# Patient Record
Sex: Male | Born: 1990 | Race: Asian | Hispanic: No | Marital: Single
Health system: Southern US, Community
[De-identification: ages and names within clinical notes are randomized; demographics above are authoritative.]

## PROBLEM LIST (undated history)

## (undated) HISTORY — PX: OTHER SURGICAL HISTORY: SHX169

---
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
## 2010-06-24 ENCOUNTER — Emergency Department (HOSPITAL_BASED_OUTPATIENT_CLINIC_OR_DEPARTMENT_OTHER)
Admission: EM | Admit: 2010-06-24 | Discharge: 2010-06-24 | Disposition: A | Discharge status: Home / Self Care | Payer: BC Managed Care – PPO | Attending: Emergency Medicine | Admitting: Emergency Medicine

## 2010-06-24 DIAGNOSIS — R042 Hemoptysis: Secondary | ICD-10-CM | POA: Insufficient documentation

## 2010-06-24 DIAGNOSIS — K089 Disorder of teeth and supporting structures, unspecified: Secondary | ICD-10-CM | POA: Insufficient documentation

## 2010-06-24 DIAGNOSIS — I10 Essential (primary) hypertension: Secondary | ICD-10-CM | POA: Insufficient documentation

## 2010-06-24 DIAGNOSIS — F17200 Nicotine dependence, unspecified, uncomplicated: Secondary | ICD-10-CM | POA: Insufficient documentation

## 2012-06-08 ENCOUNTER — Ambulatory Visit: Payer: BC Managed Care – PPO | Admitting: Gynecologic Oncology

## 2015-07-21 ENCOUNTER — Emergency Department (HOSPITAL_COMMUNITY): Payer: BLUE CROSS/BLUE SHIELD

## 2015-07-21 ENCOUNTER — Encounter (HOSPITAL_COMMUNITY): Payer: Self-pay | Admitting: *Deleted

## 2015-07-21 ENCOUNTER — Inpatient Hospital Stay (HOSPITAL_COMMUNITY)
Admission: EM | Admit: 2015-07-21 | Discharge: 2015-07-24 | DRG: 958 | Disposition: A | Discharge status: Home / Self Care | Payer: BLUE CROSS/BLUE SHIELD | Attending: General Surgery | Admitting: General Surgery

## 2015-07-21 DIAGNOSIS — T148XXA Other injury of unspecified body region, initial encounter: Secondary | ICD-10-CM

## 2015-07-21 DIAGNOSIS — S27329A Contusion of lung, unspecified, initial encounter: Secondary | ICD-10-CM

## 2015-07-21 DIAGNOSIS — F17210 Nicotine dependence, cigarettes, uncomplicated: Secondary | ICD-10-CM | POA: Diagnosis present

## 2015-07-21 DIAGNOSIS — S27322A Contusion of lung, bilateral, initial encounter: Secondary | ICD-10-CM | POA: Diagnosis present

## 2015-07-21 DIAGNOSIS — S42302B Unspecified fracture of shaft of humerus, left arm, initial encounter for open fracture: Principal | ICD-10-CM | POA: Diagnosis present

## 2015-07-21 DIAGNOSIS — S41142A Puncture wound with foreign body of left upper arm, initial encounter: Secondary | ICD-10-CM | POA: Diagnosis present

## 2015-07-21 DIAGNOSIS — W320XXA Accidental handgun discharge, initial encounter: Secondary | ICD-10-CM | POA: Diagnosis not present

## 2015-07-21 DIAGNOSIS — Y9289 Other specified places as the place of occurrence of the external cause: Secondary | ICD-10-CM

## 2015-07-21 DIAGNOSIS — S21139A Puncture wound without foreign body of unspecified front wall of thorax without penetration into thoracic cavity, initial encounter: Secondary | ICD-10-CM | POA: Diagnosis present

## 2015-07-21 DIAGNOSIS — Z419 Encounter for procedure for purposes other than remedying health state, unspecified: Secondary | ICD-10-CM

## 2015-07-21 DIAGNOSIS — Z720 Tobacco use: Secondary | ICD-10-CM | POA: Diagnosis present

## 2015-07-21 DIAGNOSIS — S41132A Puncture wound without foreign body of left upper arm, initial encounter: Secondary | ICD-10-CM

## 2015-07-21 DIAGNOSIS — W3400XA Accidental discharge from unspecified firearms or gun, initial encounter: Secondary | ICD-10-CM

## 2015-07-21 LAB — COMPREHENSIVE METABOLIC PANEL
ALBUMIN: 4.5 g/dL (ref 3.5–5.0)
ALT: 50 U/L (ref 17–63)
ANION GAP: 14 (ref 5–15)
AST: 35 U/L (ref 15–41)
Alkaline Phosphatase: 64 U/L (ref 38–126)
BUN: 12 mg/dL (ref 6–20)
CHLORIDE: 103 mmol/L (ref 101–111)
CO2: 21 mmol/L — AB (ref 22–32)
Calcium: 9 mg/dL (ref 8.9–10.3)
Creatinine, Ser: 1.34 mg/dL — ABNORMAL HIGH (ref 0.61–1.24)
GFR calc Af Amer: >60 mL/min (ref >60)
GFR calc non Af Amer: >60 mL/min (ref >60)
GLUCOSE: 120 mg/dL — AB (ref 65–99)
POTASSIUM: 3.5 mmol/L (ref 3.5–5.1)
SODIUM: 138 mmol/L (ref 135–145)
TOTAL PROTEIN: 7 g/dL (ref 6.5–8.1)
Total Bilirubin: 0.4 mg/dL (ref 0.3–1.2)

## 2015-07-21 LAB — PROTIME-INR
INR: 1.01 (ref 0.00–1.49)
Prothrombin Time: 13.5 seconds (ref 11.6–15.2)

## 2015-07-21 LAB — CBC
HEMATOCRIT: 48.4 % (ref 39.0–52.0)
HEMOGLOBIN: 17.2 g/dL — AB (ref 13.0–17.0)
MCH: 32.7 pg (ref 26.0–34.0)
MCHC: 35.5 g/dL (ref 30.0–36.0)
MCV: 92 fL (ref 78.0–100.0)
PLATELETS: 247 10*3/uL (ref 150–400)
RBC: 5.26 MIL/uL (ref 4.22–5.81)
RDW: 12.4 % (ref 11.5–15.5)
WBC: 12.1 10*3/uL — ABNORMAL HIGH (ref 4.0–10.5)

## 2015-07-21 LAB — ETHANOL: ALCOHOL ETHYL (B): 17 mg/dL — AB (ref <5)

## 2015-07-21 LAB — SAMPLE TO BLOOD BANK

## 2015-07-21 LAB — I-STAT CG4 LACTIC ACID, ED: Lactic Acid, Venous: 3.42 mmol/L (ref 0.5–2.0)

## 2015-07-21 MED ORDER — ENOXAPARIN SODIUM 40 MG/0.4ML ~~LOC~~ SOLN
40.0000 mg | SUBCUTANEOUS | Status: DC
  Administered 2015-07-22 – 2015-07-23 (×2): 40 mg via SUBCUTANEOUS
  Filled 2015-07-21 (×3): qty 0.4

## 2015-07-21 MED ORDER — TETANUS-DIPHTH-ACELL PERTUSSIS 5-2.5-18.5 LF-MCG/0.5 IM SUSP
0.5000 mL | Freq: Once | INTRAMUSCULAR | Status: AC
  Administered 2015-07-21: 0.5 mL via INTRAMUSCULAR
  Filled 2015-07-21: qty 0.5

## 2015-07-21 MED ORDER — SODIUM CHLORIDE 0.9% FLUSH
3.0000 mL | Freq: Two times a day (BID) | INTRAVENOUS | Status: DC
  Administered 2015-07-22: 3 mL via INTRAVENOUS

## 2015-07-21 MED ORDER — CLINDAMYCIN PHOSPHATE 600 MG/50ML IV SOLN
600.0000 mg | Freq: Once | INTRAVENOUS | Status: AC
  Administered 2015-07-21: 600 mg via INTRAVENOUS
  Filled 2015-07-21: qty 50

## 2015-07-21 MED ORDER — FENTANYL CITRATE (PF) 100 MCG/2ML IJ SOLN
100.0000 ug | Freq: Once | INTRAMUSCULAR | Status: AC
  Administered 2015-07-21: 100 ug via INTRAVENOUS
  Filled 2015-07-21: qty 2

## 2015-07-21 MED ORDER — HYDROMORPHONE HCL 1 MG/ML IJ SOLN
1.0000 mg | INTRAMUSCULAR | Status: DC | PRN
  Administered 2015-07-21 – 2015-07-22 (×3): 2 mg via INTRAVENOUS
  Filled 2015-07-21 (×3): qty 2

## 2015-07-21 MED ORDER — ONDANSETRON HCL 4 MG PO TABS: 4.0000 mg | ORAL_TABLET | Freq: Four times a day (QID) | ORAL | Status: DC | PRN

## 2015-07-21 MED ORDER — SODIUM CHLORIDE 0.9% FLUSH: 3.0000 mL | INTRAVENOUS | Status: DC | PRN

## 2015-07-21 MED ORDER — SODIUM CHLORIDE 0.9 % IV SOLN: 250.0000 mL | INTRAVENOUS | Status: DC | PRN

## 2015-07-21 MED ORDER — HYDROCODONE-ACETAMINOPHEN 5-325 MG PO TABS
1.0000 | ORAL_TABLET | ORAL | Status: DC | PRN
  Administered 2015-07-21 – 2015-07-23 (×4): 2 via ORAL
  Filled 2015-07-21 (×4): qty 2

## 2015-07-21 MED ORDER — ONDANSETRON HCL 4 MG/2ML IJ SOLN
4.0000 mg | Freq: Four times a day (QID) | INTRAMUSCULAR | Status: DC | PRN
  Administered 2015-07-22: 4 mg via INTRAVENOUS
  Filled 2015-07-21: qty 2

## 2015-07-21 MED ORDER — IOHEXOL 350 MG/ML SOLN
100.0000 mL | Freq: Once | INTRAVENOUS | Status: AC | PRN
  Administered 2015-07-21: 100 mL via INTRAVENOUS
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
## 2015-07-21 NOTE — ED Notes (Signed)
Patient transported to CT 
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
## 2015-07-21 NOTE — ED Notes (Signed)
Patient's lactic acid is 3.42 EDP [place] notified
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
## 2015-07-22 ENCOUNTER — Inpatient Hospital Stay (HOSPITAL_COMMUNITY): Payer: BLUE CROSS/BLUE SHIELD

## 2015-07-22 LAB — CBC
HEMATOCRIT: 44.7 % (ref 39.0–52.0)
HEMOGLOBIN: 15.6 g/dL (ref 13.0–17.0)
MCH: 31.8 pg (ref 26.0–34.0)
MCHC: 34.9 g/dL (ref 30.0–36.0)
MCV: 91.2 fL (ref 78.0–100.0)
PLATELETS: 199 10*3/uL (ref 150–400)
RBC: 4.9 MIL/uL (ref 4.22–5.81)
RDW: 12.6 % (ref 11.5–15.5)
WBC: 17.3 10*3/uL — AB (ref 4.0–10.5)

## 2015-07-22 LAB — BASIC METABOLIC PANEL
Anion gap: 8 (ref 5–15)
BUN: 8 mg/dL (ref 6–20)
CALCIUM: 9.4 mg/dL (ref 8.9–10.3)
CO2: 25 mmol/L (ref 22–32)
CREATININE: 1.05 mg/dL (ref 0.61–1.24)
Chloride: 103 mmol/L (ref 101–111)
GFR calc Af Amer: >60 mL/min (ref >60)
GLUCOSE: 104 mg/dL — AB (ref 65–99)
Potassium: 4.3 mmol/L (ref 3.5–5.1)
SODIUM: 136 mmol/L (ref 135–145)

## 2015-07-22 LAB — SURGICAL PCR SCREEN
MRSA, PCR: NEGATIVE
Staphylococcus aureus: NEGATIVE

## 2015-07-22 LAB — URINALYSIS, ROUTINE W REFLEX MICROSCOPIC
Bilirubin Urine: NEGATIVE
Glucose, UA: NEGATIVE mg/dL
KETONES UR: NEGATIVE mg/dL
LEUKOCYTES UA: NEGATIVE
NITRITE: NEGATIVE
PH: 6.5 (ref 5.0–8.0)
PROTEIN: NEGATIVE mg/dL
Specific Gravity, Urine: 1.039 — ABNORMAL HIGH (ref 1.005–1.030)

## 2015-07-22 LAB — URINE MICROSCOPIC-ADD ON

## 2015-07-22 LAB — CDS SEROLOGY

## 2015-07-22 MED ORDER — IBUPROFEN 600 MG PO TABS: 600.0000 mg | ORAL_TABLET | Freq: Four times a day (QID) | ORAL | Status: DC | PRN

## 2015-07-22 MED ORDER — DEXTROSE-NACL 5-0.9 % IV SOLN
INTRAVENOUS | Status: DC
  Administered 2015-07-23 (×2): via INTRAVENOUS

## 2015-07-22 MED ORDER — BACITRACIN-NEOMYCIN-POLYMYXIN OINTMENT TUBE
1.0000 | TOPICAL_OINTMENT | Freq: Two times a day (BID) | CUTANEOUS | Status: DC
Start: 2015-07-22 — End: 2015-07-24
  Administered 2015-07-22 – 2015-07-24 (×5): 1 via TOPICAL
  Filled 2015-07-22: qty 15

## 2015-07-22 MED ORDER — HYDROMORPHONE HCL 1 MG/ML IJ SOLN
1.0000 mg | INTRAMUSCULAR | Status: DC | PRN
  Administered 2015-07-22 – 2015-07-23 (×4): 1 mg via INTRAVENOUS
  Filled 2015-07-22 (×4): qty 1

## 2015-07-22 MED ORDER — INFLUENZA VAC SPLIT QUAD 0.5 ML IM SUSY: 0.5000 mL | PREFILLED_SYRINGE | INTRAMUSCULAR | Status: DC | PRN
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
## 2015-07-23 ENCOUNTER — Inpatient Hospital Stay (HOSPITAL_COMMUNITY): Payer: BLUE CROSS/BLUE SHIELD | Admitting: Anesthesiology

## 2015-07-23 ENCOUNTER — Encounter (HOSPITAL_COMMUNITY): Admission: EM | Disposition: A | Discharge status: Home / Self Care | Payer: Self-pay | Source: Home / Self Care

## 2015-07-23 ENCOUNTER — Encounter (HOSPITAL_COMMUNITY): Payer: Self-pay | Admitting: Anesthesiology

## 2015-07-23 ENCOUNTER — Inpatient Hospital Stay (HOSPITAL_COMMUNITY): Payer: BLUE CROSS/BLUE SHIELD

## 2015-07-23 HISTORY — PX: ORIF HUMERUS FRACTURE: SHX2126

## 2015-07-23 SURGERY — OPEN REDUCTION INTERNAL FIXATION (ORIF) HUMERAL SHAFT FRACTURE
Anesthesia: General | Site: Arm Upper | Laterality: Left

## 2015-07-23 MED ORDER — HYDROMORPHONE HCL 1 MG/ML IJ SOLN
0.5000 mg | INTRAMUSCULAR | Status: DC | PRN
  Administered 2015-07-23 – 2015-07-24 (×4): 0.5 mg via INTRAVENOUS
  Filled 2015-07-23 (×4): qty 1

## 2015-07-23 MED ORDER — FENTANYL CITRATE (PF) 100 MCG/2ML IJ SOLN
INTRAMUSCULAR | Status: DC | PRN
  Administered 2015-07-23 (×2): 50 ug via INTRAVENOUS
  Administered 2015-07-23: 25 ug via INTRAVENOUS
  Administered 2015-07-23: 200 ug via INTRAVENOUS
  Administered 2015-07-23: 25 ug via INTRAVENOUS
  Administered 2015-07-23 (×3): 50 ug via INTRAVENOUS

## 2015-07-23 MED ORDER — MEPERIDINE HCL 25 MG/ML IJ SOLN: 6.2500 mg | INTRAMUSCULAR | Status: DC | PRN

## 2015-07-23 MED ORDER — BACITRACIN ZINC 500 UNIT/GM EX OINT
TOPICAL_OINTMENT | CUTANEOUS | Status: AC
  Filled 2015-07-23: qty 28.35

## 2015-07-23 MED ORDER — SODIUM CHLORIDE 0.9 % IJ SOLN
INTRAMUSCULAR | Status: AC
  Filled 2015-07-23: qty 10

## 2015-07-23 MED ORDER — ROCURONIUM BROMIDE 100 MG/10ML IV SOLN
INTRAVENOUS | Status: DC | PRN
  Administered 2015-07-23: 20 mg via INTRAVENOUS
  Administered 2015-07-23: 10 mg via INTRAVENOUS
  Administered 2015-07-23: 40 mg via INTRAVENOUS
  Administered 2015-07-23: 10 mg via INTRAVENOUS

## 2015-07-23 MED ORDER — ONDANSETRON HCL 4 MG/2ML IJ SOLN
INTRAMUSCULAR | Status: DC | PRN
  Administered 2015-07-23: 4 mg via INTRAVENOUS

## 2015-07-23 MED ORDER — BUPIVACAINE HCL 0.25 % IJ SOLN
INTRAMUSCULAR | Status: DC | PRN
  Administered 2015-07-23: 10 mL

## 2015-07-23 MED ORDER — OXYCODONE HCL 5 MG PO TABS
5.0000 mg | ORAL_TABLET | ORAL | Status: DC | PRN
  Administered 2015-07-23: 10 mg via ORAL
  Administered 2015-07-23 – 2015-07-24 (×3): 15 mg via ORAL
  Filled 2015-07-23 (×3): qty 3

## 2015-07-23 MED ORDER — FENTANYL CITRATE (PF) 250 MCG/5ML IJ SOLN
INTRAMUSCULAR | Status: AC
  Filled 2015-07-23: qty 5

## 2015-07-23 MED ORDER — LACTATED RINGERS IV SOLN
INTRAVENOUS | Status: DC
Start: 2015-07-23 — End: 2015-07-24
  Administered 2015-07-23: 14:00:00 via INTRAVENOUS

## 2015-07-23 MED ORDER — LIDOCAINE HCL (CARDIAC) 20 MG/ML IV SOLN
INTRAVENOUS | Status: AC
  Filled 2015-07-23: qty 5

## 2015-07-23 MED ORDER — LIDOCAINE HCL (CARDIAC) 20 MG/ML IV SOLN
INTRAVENOUS | Status: DC | PRN
  Administered 2015-07-23: 100 mg via INTRAVENOUS

## 2015-07-23 MED ORDER — SUGAMMADEX SODIUM 200 MG/2ML IV SOLN
INTRAVENOUS | Status: AC
  Filled 2015-07-23: qty 2

## 2015-07-23 MED ORDER — SUGAMMADEX SODIUM 200 MG/2ML IV SOLN
INTRAVENOUS | Status: DC | PRN
  Administered 2015-07-23: 160 mg via INTRAVENOUS

## 2015-07-23 MED ORDER — PROPOFOL 10 MG/ML IV BOLUS
INTRAVENOUS | Status: DC | PRN
  Administered 2015-07-23: 150 mg via INTRAVENOUS

## 2015-07-23 MED ORDER — HYDROMORPHONE HCL 1 MG/ML IJ SOLN
0.2500 mg | INTRAMUSCULAR | Status: DC | PRN
  Administered 2015-07-23 (×2): 0.5 mg via INTRAVENOUS

## 2015-07-23 MED ORDER — MIDAZOLAM HCL 2 MG/2ML IJ SOLN
INTRAMUSCULAR | Status: AC
  Filled 2015-07-23: qty 2

## 2015-07-23 MED ORDER — EPHEDRINE SULFATE 50 MG/ML IJ SOLN
INTRAMUSCULAR | Status: AC
  Filled 2015-07-23: qty 1

## 2015-07-23 MED ORDER — CEFAZOLIN SODIUM-DEXTROSE 2-3 GM-% IV SOLR
INTRAVENOUS | Status: DC | PRN
  Administered 2015-07-23: 2 g via INTRAVENOUS

## 2015-07-23 MED ORDER — POLYETHYLENE GLYCOL 3350 17 G PO PACK
17.0000 g | PACK | Freq: Every day | ORAL | Status: DC
  Administered 2015-07-24: 17 g via ORAL
  Filled 2015-07-23: qty 1

## 2015-07-23 MED ORDER — DOCUSATE SODIUM 100 MG PO CAPS
100.0000 mg | ORAL_CAPSULE | Freq: Two times a day (BID) | ORAL | Status: DC
  Administered 2015-07-23 – 2015-07-24 (×2): 100 mg via ORAL
  Filled 2015-07-23 (×3): qty 1

## 2015-07-23 MED ORDER — OXYCODONE HCL 5 MG PO TABS
ORAL_TABLET | ORAL | Status: AC
  Administered 2015-07-23: 10 mg via ORAL
  Filled 2015-07-23: qty 2

## 2015-07-23 MED ORDER — PROPOFOL 10 MG/ML IV BOLUS
INTRAVENOUS | Status: AC
  Filled 2015-07-23: qty 20

## 2015-07-23 MED ORDER — CEFAZOLIN SODIUM-DEXTROSE 2-3 GM-% IV SOLR
2.0000 g | Freq: Four times a day (QID) | INTRAVENOUS | Status: DC
  Administered 2015-07-23 – 2015-07-24 (×3): 2 g via INTRAVENOUS
  Filled 2015-07-23 (×3): qty 50

## 2015-07-23 MED ORDER — ONDANSETRON HCL 4 MG/2ML IJ SOLN: 4.0000 mg | Freq: Once | INTRAMUSCULAR | Status: DC | PRN

## 2015-07-23 MED ORDER — BUPIVACAINE HCL (PF) 0.25 % IJ SOLN
INTRAMUSCULAR | Status: AC
  Filled 2015-07-23: qty 30

## 2015-07-23 MED ORDER — PHENYLEPHRINE HCL 10 MG/ML IJ SOLN
INTRAMUSCULAR | Status: DC | PRN
  Administered 2015-07-23: 120 ug via INTRAVENOUS
  Administered 2015-07-23: 80 ug via INTRAVENOUS
  Administered 2015-07-23: 120 ug via INTRAVENOUS
  Administered 2015-07-23: 80 ug via INTRAVENOUS

## 2015-07-23 MED ORDER — PHENYLEPHRINE HCL 10 MG/ML IJ SOLN
10.0000 mg | INTRAMUSCULAR | Status: DC | PRN
  Administered 2015-07-23: 15 ug/min via INTRAVENOUS

## 2015-07-23 MED ORDER — BACITRACIN ZINC 500 UNIT/GM EX OINT
TOPICAL_OINTMENT | CUTANEOUS | Status: DC | PRN
  Administered 2015-07-23: 1 via TOPICAL

## 2015-07-23 MED ORDER — ONDANSETRON HCL 4 MG/2ML IJ SOLN
INTRAMUSCULAR | Status: AC
  Filled 2015-07-23: qty 2

## 2015-07-23 MED ORDER — 0.9 % SODIUM CHLORIDE (POUR BTL) OPTIME
TOPICAL | Status: DC | PRN
  Administered 2015-07-23: 1000 mL

## 2015-07-23 MED ORDER — HYDROMORPHONE HCL 1 MG/ML IJ SOLN
INTRAMUSCULAR | Status: AC
  Administered 2015-07-23: 0.5 mg via INTRAVENOUS
  Filled 2015-07-23: qty 1

## 2015-07-23 MED ORDER — ROCURONIUM BROMIDE 50 MG/5ML IV SOLN
INTRAVENOUS | Status: AC
  Filled 2015-07-23: qty 1

## 2015-07-23 SURGICAL SUPPLY — 73 items
BANDAGE ACE 6X5 VEL STRL LF (GAUZE/BANDAGES/DRESSINGS) ×3 IMPLANT
BANDAGE ELASTIC 3 VELCRO ST LF (GAUZE/BANDAGES/DRESSINGS) IMPLANT
BANDAGE ELASTIC 4 VELCRO ST LF (GAUZE/BANDAGES/DRESSINGS) IMPLANT
BENZOIN TINCTURE PRP APPL 2/3 (GAUZE/BANDAGES/DRESSINGS) IMPLANT
BIT DRILL 2.6 (BIT) ×3 IMPLANT
BIT DRILL 3.5MM (BIT) ×1
BIT DRILL OVERDRILL 3.5X122 (BIT) ×2 IMPLANT
BLADE SURG 10 STRL SS (BLADE) IMPLANT
BLADE SURG ROTATE 9660 (MISCELLANEOUS) ×3 IMPLANT
BNDG COHESIVE 4X5 TAN STRL (GAUZE/BANDAGES/DRESSINGS) ×3 IMPLANT
BNDG ESMARK 4X9 LF (GAUZE/BANDAGES/DRESSINGS) IMPLANT
BNDG GAUZE ELAST 4 BULKY (GAUZE/BANDAGES/DRESSINGS) IMPLANT
CLOSURE WOUND 1/2 X4 (GAUZE/BANDAGES/DRESSINGS) ×1
CONT SPEC 4OZ CLIKSEAL STRL BL (MISCELLANEOUS) ×3 IMPLANT
COVER SURGICAL LIGHT HANDLE (MISCELLANEOUS) ×3 IMPLANT
CUFF TOURNIQUET SINGLE 18IN (TOURNIQUET CUFF) IMPLANT
CUFF TOURNIQUET SINGLE 24IN (TOURNIQUET CUFF) IMPLANT
DRAPE C-ARM 42X72 X-RAY (DRAPES) ×3 IMPLANT
DRAPE IMP U-DRAPE 54X76 (DRAPES) ×6 IMPLANT
DRAPE INCISE IOBAN 66X45 STRL (DRAPES) ×3 IMPLANT
DRAPE ORTHO SPLIT 77X108 STRL (DRAPES) ×4
DRAPE SURG ORHT 6 SPLT 77X108 (DRAPES) ×2 IMPLANT
DRAPE U-SHAPE 47X51 STRL (DRAPES) ×3 IMPLANT
DRSG MEPILEX BORDER 4X12 (GAUZE/BANDAGES/DRESSINGS) ×3 IMPLANT
DRSG MEPILEX BORDER 4X4 (GAUZE/BANDAGES/DRESSINGS) ×3 IMPLANT
DRSG PAD ABDOMINAL 8X10 ST (GAUZE/BANDAGES/DRESSINGS) ×3 IMPLANT
DURAPREP 26ML APPLICATOR (WOUND CARE) ×3 IMPLANT
ELECT REM PT RETURN 9FT ADLT (ELECTROSURGICAL) ×3
ELECTRODE REM PT RTRN 9FT ADLT (ELECTROSURGICAL) ×1 IMPLANT
GAUZE SPONGE 4X4 12PLY STRL (GAUZE/BANDAGES/DRESSINGS) ×3 IMPLANT
GAUZE XEROFORM 5X9 LF (GAUZE/BANDAGES/DRESSINGS) IMPLANT
GLOVE BIO SURGEON STRL SZ7 (GLOVE) ×3 IMPLANT
GLOVE BIO SURGEON STRL SZ7.5 (GLOVE) ×3 IMPLANT
GLOVE BIOGEL PI IND STRL 7.0 (GLOVE) ×1 IMPLANT
GLOVE BIOGEL PI IND STRL 8 (GLOVE) ×1 IMPLANT
GLOVE BIOGEL PI INDICATOR 7.0 (GLOVE) ×2
GLOVE BIOGEL PI INDICATOR 8 (GLOVE) ×2
GOWN STRL REUS W/ TWL LRG LVL3 (GOWN DISPOSABLE) ×2 IMPLANT
GOWN STRL REUS W/ TWL XL LVL3 (GOWN DISPOSABLE) IMPLANT
GOWN STRL REUS W/TWL LRG LVL3 (GOWN DISPOSABLE) ×4
GOWN STRL REUS W/TWL XL LVL3 (GOWN DISPOSABLE)
KIT BASIN OR (CUSTOM PROCEDURE TRAY) ×3 IMPLANT
KIT ROOM TURNOVER OR (KITS) ×3 IMPLANT
MANIFOLD NEPTUNE II (INSTRUMENTS) IMPLANT
NS IRRIG 1000ML POUR BTL (IV SOLUTION) ×3 IMPLANT
PACK ORTHO EXTREMITY (CUSTOM PROCEDURE TRAY) ×3 IMPLANT
PAD ARMBOARD 7.5X6 YLW CONV (MISCELLANEOUS) ×3 IMPLANT
PAD CAST 4YDX4 CTTN HI CHSV (CAST SUPPLIES) IMPLANT
PADDING CAST COTTON 4X4 STRL (CAST SUPPLIES)
PLATE COMPRESSION 14 HOLE (Plate) ×3 IMPLANT
SCREW BONE 3.5X30MM (Screw) ×3 IMPLANT
SCREW BONE 3.5X34 (Screw) ×3 IMPLANT
SCREW BONE ANKLE 3.5X28MM (Screw) ×9 IMPLANT
SCREW NONLOCK 22MM (Screw) ×6 IMPLANT
SCREW NONLOCK 24MM (Screw) ×15 IMPLANT
SPONGE LAP 18X18 X RAY DECT (DISPOSABLE) ×3 IMPLANT
STAPLER VISISTAT 35W (STAPLE) ×3 IMPLANT
STRIP CLOSURE SKIN 1/2X4 (GAUZE/BANDAGES/DRESSINGS) ×2 IMPLANT
SUCTION FRAZIER HANDLE 10FR (MISCELLANEOUS) ×2
SUCTION TUBE FRAZIER 10FR DISP (MISCELLANEOUS) ×1 IMPLANT
SUT ETHILON 3 0 PS 1 (SUTURE) ×12 IMPLANT
SUT MNCRL AB 4-0 PS2 18 (SUTURE) IMPLANT
SUT MON AB 2-0 CT1 36 (SUTURE) IMPLANT
SUT VIC AB 0 CT1 27 (SUTURE)
SUT VIC AB 0 CT1 27XBRD ANBCTR (SUTURE) IMPLANT
SYR CONTROL 10ML LL (SYRINGE) ×3 IMPLANT
TOWEL OR 17X24 6PK STRL BLUE (TOWEL DISPOSABLE) ×3 IMPLANT
TOWEL OR 17X26 10 PK STRL BLUE (TOWEL DISPOSABLE) ×3 IMPLANT
TUBE CONNECTING 12'X1/4 (SUCTIONS) ×1
TUBE CONNECTING 12X1/4 (SUCTIONS) ×2 IMPLANT
UNDERPAD 30X30 INCONTINENT (UNDERPADS AND DIAPERS) ×3 IMPLANT
WATER STERILE IRR 1000ML POUR (IV SOLUTION) IMPLANT
YANKAUER SUCT BULB TIP NO VENT (SUCTIONS) ×3 IMPLANT
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
## 2015-07-23 NOTE — Transfer of Care (Signed)
Immediate Anesthesia Transfer of Care Note  Patient: Colton Schroeder  Procedure(s) Performed: Procedure(s) with comments: OPEN REDUCTION INTERNAL FIXATION (ORIF) HUMERAL SHAFT FRACTURE (Left) - supine, large carm, I am available at 2 on monday  Patient Location: PACU  Anesthesia Type:General  Level of Consciousness: awake, alert , oriented and patient cooperative  Airway & Oxygen Therapy: Patient Spontanous Breathing  Post-op Assessment: Report given to RN and Post -op Vital signs reviewed and stable  Post vital signs: Reviewed and stable  Last Vitals:  Filed Vitals:   07/22/15 2329 07/23/15 0714  BP: 137/73 129/68  Pulse: 78 65  Temp: 37 C 36.8 C  Resp: 18 18    Complications: No apparent anesthesia complications
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
## 2015-07-23 NOTE — Anesthesia Procedure Notes (Signed)
Procedure Name: Intubation Date/Time: 07/23/2015 1:30 PM Performed by: Sharlene DoryWALKER, Jennyfer Nickolson E Pre-anesthesia Checklist: Patient identified, Emergency Drugs available, Suction available, Patient being monitored and Timeout performed Patient Re-evaluated:Patient Re-evaluated prior to inductionOxygen Delivery Method: Circle system utilized Preoxygenation: Pre-oxygenation with 100% oxygen Intubation Type: IV induction Ventilation: Mask ventilation without difficulty Laryngoscope Size: Mac and 4 Grade View: Grade I Tube type: Oral Tube size: 7.5 mm Number of attempts: 1 Airway Equipment and Method: Stylet Placement Confirmation: ETT inserted through vocal cords under direct vision,  positive ETCO2 and breath sounds checked- equal and bilateral Secured at: 22 cm Tube secured with: Tape Dental Injury: Teeth and Oropharynx as per pre-operative assessment
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
## 2015-07-23 NOTE — Interval H&P Note (Signed)
History and Physical Interval Note:  I performed a physical exam of his left upper extremity preoperatively at this point he reports tingling in his entire hand. He did seem to have intact flexion and extension of his thumb but did have decreased sensation in median ulnar and radial nerve distribution.    07/23/2015 4:46 PM  Colton Schroeder  has presented today for surgery, with the diagnosis of left humerus fracture  The various methods of treatment have been discussed with the patient and family. After consideration of risks, benefits and other options for treatment, the patient has consented to  Procedure(s) with comments: OPEN REDUCTION INTERNAL FIXATION (ORIF) HUMERAL SHAFT FRACTURE (Left) - supine, large carm, I am available at 2 on monday as a surgical intervention .  The patient's history has been reviewed, patient examined, no change in status, stable for surgery.  I have reviewed the patient's chart and labs.  Questions were answered to the patient's satisfaction.     Gertrude Tarbet D

## 2015-07-24 ENCOUNTER — Encounter (HOSPITAL_COMMUNITY): Payer: Self-pay | Admitting: Orthopedic Surgery

## 2015-07-24 MED ORDER — OXYCODONE HCL 5 MG PO TABS
10.0000 mg | ORAL_TABLET | ORAL | Status: DC | PRN
  Administered 2015-07-24: 20 mg via ORAL
  Filled 2015-07-24: qty 4

## 2015-07-24 MED ORDER — OXYCODONE-ACETAMINOPHEN 10-325 MG PO TABS: 1.0000 | ORAL_TABLET | ORAL | Status: AC | PRN

## 2015-07-24 MED ORDER — TRAMADOL HCL 50 MG PO TABS: 100.0000 mg | ORAL_TABLET | Freq: Four times a day (QID) | ORAL | Status: AC

## 2015-07-24 MED ORDER — PREGABALIN 75 MG PO CAPS
75.0000 mg | ORAL_CAPSULE | Freq: Two times a day (BID) | ORAL | Status: DC
  Administered 2015-07-24: 75 mg via ORAL
  Filled 2015-07-24: qty 1

## 2015-07-24 MED ORDER — PREGABALIN 75 MG PO CAPS: 75.0000 mg | ORAL_CAPSULE | Freq: Two times a day (BID) | ORAL | Status: AC

## 2015-07-24 MED ORDER — TRAMADOL HCL 50 MG PO TABS
100.0000 mg | ORAL_TABLET | Freq: Four times a day (QID) | ORAL | Status: DC
  Administered 2015-07-24 (×3): 100 mg via ORAL
  Filled 2015-07-24 (×3): qty 2
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
## 2015-07-24 NOTE — Anesthesia Postprocedure Evaluation (Signed)
Anesthesia Post Note  Patient: Johney Framehong XXXNguyen  Procedure(s) Performed: Procedure(s) (LRB): OPEN REDUCTION INTERNAL FIXATION (ORIF) HUMERAL SHAFT FRACTURE (Left)  Patient location during evaluation: PACU Anesthesia Type: General Level of consciousness: awake and alert Pain management: pain level controlled Vital Signs Assessment: post-procedure vital signs reviewed and stable Respiratory status: spontaneous breathing, nonlabored ventilation and respiratory function stable Cardiovascular status: blood pressure returned to baseline and stable Postop Assessment: no signs of nausea or vomiting Anesthetic complications: no    Last Vitals:  Filed Vitals:   07/24/15 0354 07/24/15 0956  BP: 133/66 128/70  Pulse: 101 89  Temp: 36.9 C 36.8 C  Resp: 18 18    Last Pain:  Filed Vitals:   07/24/15 1014  PainSc: 9                  Deeksha Cotrell A

## 2015-07-24 NOTE — Progress Notes (Signed)
Ok for dispo from ortho standpoint  Sling full time F/u 2wks in my office   MURPHY, TIMOTHY D
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
## 2015-07-24 NOTE — Discharge Instructions (Signed)
Keep arm in splint. No lifting, pulling, or pushing.  Wash wounds daily in shower with soap and water. Do not soak. Apply antibiotic ointment (e.g. Neosporin) twice daily and as needed to keep moist. Cover with dry dressing.  No driving while taking oxycodone.

## 2015-07-24 NOTE — Progress Notes (Signed)
Orthopedic Tech Progress Note Patient Details:  Johney Framehong XXXNguyen 1990-06-04 [id number]  Ortho Devices Type of Ortho Device: Sling immobilizer Ortho Device/Splint Interventions: Application   Saul FordyceJennifer C Ameila Weldon 07/24/2015, 9:56 AM
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
## 2015-08-01 ENCOUNTER — Encounter (HOSPITAL_COMMUNITY): Payer: Self-pay | Admitting: Orthopedic Surgery

## 2015-10-23 ENCOUNTER — Encounter (HOSPITAL_BASED_OUTPATIENT_CLINIC_OR_DEPARTMENT_OTHER): Payer: Self-pay | Admitting: *Deleted

## 2015-10-23 ENCOUNTER — Emergency Department (HOSPITAL_BASED_OUTPATIENT_CLINIC_OR_DEPARTMENT_OTHER): Payer: BLUE CROSS/BLUE SHIELD

## 2015-10-23 ENCOUNTER — Emergency Department (HOSPITAL_BASED_OUTPATIENT_CLINIC_OR_DEPARTMENT_OTHER)
Admission: EM | Admit: 2015-10-23 | Discharge: 2015-10-23 | Disposition: A | Discharge status: Home / Self Care | Payer: BLUE CROSS/BLUE SHIELD | Attending: Emergency Medicine | Admitting: Emergency Medicine

## 2015-10-23 DIAGNOSIS — F17210 Nicotine dependence, cigarettes, uncomplicated: Secondary | ICD-10-CM | POA: Diagnosis not present

## 2015-10-23 DIAGNOSIS — Y939 Activity, unspecified: Secondary | ICD-10-CM | POA: Insufficient documentation

## 2015-10-23 DIAGNOSIS — Y999 Unspecified external cause status: Secondary | ICD-10-CM | POA: Diagnosis not present

## 2015-10-23 DIAGNOSIS — S022XXA Fracture of nasal bones, initial encounter for closed fracture: Secondary | ICD-10-CM | POA: Diagnosis not present

## 2015-10-23 DIAGNOSIS — Y929 Unspecified place or not applicable: Secondary | ICD-10-CM | POA: Insufficient documentation

## 2015-10-23 DIAGNOSIS — W2201XA Walked into wall, initial encounter: Secondary | ICD-10-CM | POA: Diagnosis not present

## 2015-10-23 DIAGNOSIS — S6991XA Unspecified injury of right wrist, hand and finger(s), initial encounter: Secondary | ICD-10-CM | POA: Diagnosis present

## 2015-10-23 DIAGNOSIS — S62396A Other fracture of fifth metacarpal bone, right hand, initial encounter for closed fracture: Secondary | ICD-10-CM | POA: Diagnosis not present

## 2015-10-23 DIAGNOSIS — S62309A Unspecified fracture of unspecified metacarpal bone, initial encounter for closed fracture: Secondary | ICD-10-CM

## 2015-10-23 NOTE — Discharge Instructions (Signed)
You have been diagnosed with distal metacarpal fracture with angulation. Please follow- up with Dr. Amanda PeaGramig for this. You also likely have a nasal bone fracture, please follow-up with Dr. Jearld FentonByers for this.
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
## 2018-09-30 ENCOUNTER — Inpatient Hospital Stay (HOSPITAL_COMMUNITY): Payer: No Typology Code available for payment source | Admitting: Certified Registered Nurse Anesthetist

## 2018-09-30 ENCOUNTER — Encounter (HOSPITAL_COMMUNITY): Payer: Self-pay | Admitting: Emergency Medicine

## 2018-09-30 ENCOUNTER — Emergency Department (HOSPITAL_COMMUNITY): Payer: No Typology Code available for payment source

## 2018-09-30 ENCOUNTER — Inpatient Hospital Stay (HOSPITAL_COMMUNITY): Payer: No Typology Code available for payment source

## 2018-09-30 ENCOUNTER — Inpatient Hospital Stay (HOSPITAL_COMMUNITY)
Admission: EM | Admit: 2018-09-30 | Discharge: 2018-10-03 | DRG: 494 | Disposition: A | Discharge status: Home / Self Care | Payer: No Typology Code available for payment source | Attending: General Surgery | Admitting: General Surgery

## 2018-09-30 ENCOUNTER — Encounter (HOSPITAL_COMMUNITY): Admission: EM | Disposition: A | Discharge status: Home / Self Care | Payer: Self-pay | Source: Home / Self Care

## 2018-09-30 ENCOUNTER — Other Ambulatory Visit: Payer: Self-pay

## 2018-09-30 DIAGNOSIS — S0181XA Laceration without foreign body of other part of head, initial encounter: Secondary | ICD-10-CM | POA: Diagnosis present

## 2018-09-30 DIAGNOSIS — R402132 Coma scale, eyes open, to sound, at arrival to emergency department: Secondary | ICD-10-CM | POA: Diagnosis present

## 2018-09-30 DIAGNOSIS — R402362 Coma scale, best motor response, obeys commands, at arrival to emergency department: Secondary | ICD-10-CM | POA: Diagnosis present

## 2018-09-30 DIAGNOSIS — Z419 Encounter for procedure for purposes other than remedying health state, unspecified: Secondary | ICD-10-CM

## 2018-09-30 DIAGNOSIS — S060X0A Concussion without loss of consciousness, initial encounter: Secondary | ICD-10-CM | POA: Diagnosis present

## 2018-09-30 DIAGNOSIS — Z1159 Encounter for screening for other viral diseases: Secondary | ICD-10-CM

## 2018-09-30 DIAGNOSIS — N35919 Unspecified urethral stricture, male, unspecified site: Secondary | ICD-10-CM | POA: Diagnosis present

## 2018-09-30 DIAGNOSIS — S060X9A Concussion with loss of consciousness of unspecified duration, initial encounter: Secondary | ICD-10-CM

## 2018-09-30 DIAGNOSIS — S82145B Nondisplaced bicondylar fracture of left tibia, initial encounter for open fracture type I or II: Principal | ICD-10-CM | POA: Diagnosis present

## 2018-09-30 DIAGNOSIS — T148XXA Other injury of unspecified body region, initial encounter: Secondary | ICD-10-CM

## 2018-09-30 DIAGNOSIS — Y92410 Unspecified street and highway as the place of occurrence of the external cause: Secondary | ICD-10-CM | POA: Diagnosis not present

## 2018-09-30 DIAGNOSIS — F17210 Nicotine dependence, cigarettes, uncomplicated: Secondary | ICD-10-CM | POA: Diagnosis present

## 2018-09-30 DIAGNOSIS — S060XAA Concussion with loss of consciousness status unknown, initial encounter: Secondary | ICD-10-CM

## 2018-09-30 DIAGNOSIS — R402242 Coma scale, best verbal response, confused conversation, at arrival to emergency department: Secondary | ICD-10-CM | POA: Diagnosis present

## 2018-09-30 DIAGNOSIS — S82142B Displaced bicondylar fracture of left tibia, initial encounter for open fracture type I or II: Secondary | ICD-10-CM | POA: Diagnosis present

## 2018-09-30 HISTORY — PX: ORIF TIBIA PLATEAU: SHX2132

## 2018-09-30 HISTORY — PX: I & D EXTREMITY: SHX5045

## 2018-09-30 LAB — CBC
HCT: 44.9 % (ref 39.0–52.0)
Hemoglobin: 14.7 g/dL (ref 13.0–17.0)
MCH: 30.6 pg (ref 26.0–34.0)
MCHC: 32.7 g/dL (ref 30.0–36.0)
MCV: 93.5 fL (ref 80.0–100.0)
Platelets: 253 10*3/uL (ref 150–400)
RBC: 4.8 MIL/uL (ref 4.22–5.81)
RDW: 12.3 % (ref 11.5–15.5)
WBC: 8.2 10*3/uL (ref 4.0–10.5)
nRBC: 0 % (ref 0.0–0.2)

## 2018-09-30 LAB — ETHANOL: Alcohol, Ethyl (B): <10 mg/dL (ref <10)

## 2018-09-30 LAB — LACTIC ACID, PLASMA: Lactic Acid, Venous: 1.9 mmol/L (ref 0.5–1.9)

## 2018-09-30 LAB — COMPREHENSIVE METABOLIC PANEL
ALT: 65 U/L — ABNORMAL HIGH (ref 0–44)
AST: 33 U/L (ref 15–41)
Albumin: 4 g/dL (ref 3.5–5.0)
Alkaline Phosphatase: 54 U/L (ref 38–126)
Anion gap: 11 (ref 5–15)
BUN: 14 mg/dL (ref 6–20)
CO2: 26 mmol/L (ref 22–32)
Calcium: 9.1 mg/dL (ref 8.9–10.3)
Chloride: 103 mmol/L (ref 98–111)
Creatinine, Ser: 1.63 mg/dL — ABNORMAL HIGH (ref 0.61–1.24)
GFR calc Af Amer: 29 mL/min — ABNORMAL LOW (ref >60)
GFR calc non Af Amer: 25 mL/min — ABNORMAL LOW (ref >60)
Glucose, Bld: 119 mg/dL — ABNORMAL HIGH (ref 70–99)
Potassium: 3.7 mmol/L (ref 3.5–5.1)
Sodium: 140 mmol/L (ref 135–145)
Total Bilirubin: 0.7 mg/dL (ref 0.3–1.2)
Total Protein: 6.3 g/dL — ABNORMAL LOW (ref 6.5–8.1)

## 2018-09-30 LAB — RAPID URINE DRUG SCREEN, HOSP PERFORMED
Amphetamines: POSITIVE — AB
Barbiturates: NOT DETECTED
Benzodiazepines: NOT DETECTED
Cocaine: NOT DETECTED
Opiates: NOT DETECTED
Tetrahydrocannabinol: POSITIVE — AB

## 2018-09-30 LAB — CDS SEROLOGY

## 2018-09-30 LAB — URINALYSIS, ROUTINE W REFLEX MICROSCOPIC
Bilirubin Urine: NEGATIVE
Glucose, UA: NEGATIVE mg/dL
Ketones, ur: NEGATIVE mg/dL
Leukocytes,Ua: NEGATIVE
Nitrite: NEGATIVE
Protein, ur: NEGATIVE mg/dL
Specific Gravity, Urine: >1.046 — ABNORMAL HIGH (ref 1.005–1.030)
pH: 6 (ref 5.0–8.0)

## 2018-09-30 LAB — GLUCOSE, CAPILLARY: Glucose-Capillary: 104 mg/dL — ABNORMAL HIGH (ref 70–99)

## 2018-09-30 LAB — I-STAT CHEM 8, ED
BUN: 15 mg/dL (ref 6–20)
Calcium, Ion: 1.17 mmol/L (ref 1.15–1.40)
Chloride: 105 mmol/L (ref 98–111)
Creatinine, Ser: 1.3 mg/dL — ABNORMAL HIGH (ref 0.61–1.24)
Glucose, Bld: 133 mg/dL — ABNORMAL HIGH (ref 70–99)
HCT: 44 % (ref 39.0–52.0)
Hemoglobin: 15 g/dL (ref 13.0–17.0)
Potassium: 3.6 mmol/L (ref 3.5–5.1)
Sodium: 139 mmol/L (ref 135–145)
TCO2: 25 mmol/L (ref 22–32)

## 2018-09-30 LAB — SARS CORONAVIRUS 2 BY RT PCR (HOSPITAL ORDER, PERFORMED IN ~~LOC~~ HOSPITAL LAB): SARS Coronavirus 2: NEGATIVE

## 2018-09-30 LAB — SAMPLE TO BLOOD BANK

## 2018-09-30 LAB — PROTIME-INR
INR: 1 (ref 0.8–1.2)
Prothrombin Time: 13.2 seconds (ref 11.4–15.2)

## 2018-09-30 SURGERY — IRRIGATION AND DEBRIDEMENT EXTREMITY
Anesthesia: General | Site: Leg Lower | Laterality: Left

## 2018-09-30 MED ORDER — FENTANYL CITRATE (PF) 250 MCG/5ML IJ SOLN
INTRAMUSCULAR | Status: DC | PRN
  Administered 2018-09-30: 50 ug via INTRAVENOUS
  Administered 2018-09-30: 100 ug via INTRAVENOUS
  Administered 2018-09-30: 25 ug via INTRAVENOUS

## 2018-09-30 MED ORDER — LIDOCAINE 2% (20 MG/ML) 5 ML SYRINGE
INTRAMUSCULAR | Status: AC
  Filled 2018-09-30: qty 5

## 2018-09-30 MED ORDER — HYDRALAZINE HCL 20 MG/ML IJ SOLN: 10.0000 mg | INTRAMUSCULAR | Status: DC | PRN

## 2018-09-30 MED ORDER — PROPOFOL 10 MG/ML IV BOLUS
INTRAVENOUS | Status: DC | PRN
  Administered 2018-09-30: 170 mg via INTRAVENOUS

## 2018-09-30 MED ORDER — METOPROLOL TARTRATE 5 MG/5ML IV SOLN: 5.0000 mg | Freq: Four times a day (QID) | INTRAVENOUS | Status: DC | PRN

## 2018-09-30 MED ORDER — KCL IN DEXTROSE-NACL 20-5-0.45 MEQ/L-%-% IV SOLN
INTRAVENOUS | Status: DC
  Administered 2018-09-30 – 2018-10-02 (×4): via INTRAVENOUS
  Filled 2018-09-30 (×5): qty 1000

## 2018-09-30 MED ORDER — DEXAMETHASONE SODIUM PHOSPHATE 10 MG/ML IJ SOLN
INTRAMUSCULAR | Status: AC
  Filled 2018-09-30: qty 1

## 2018-09-30 MED ORDER — VANCOMYCIN HCL 1000 MG IV SOLR
INTRAVENOUS | Status: DC | PRN
  Administered 2018-09-30: 1000 mg

## 2018-09-30 MED ORDER — ONDANSETRON HCL 4 MG/2ML IJ SOLN
INTRAMUSCULAR | Status: DC | PRN
  Administered 2018-09-30: 4 mg via INTRAVENOUS

## 2018-09-30 MED ORDER — ROCURONIUM BROMIDE 10 MG/ML (PF) SYRINGE
PREFILLED_SYRINGE | INTRAVENOUS | Status: DC | PRN
  Administered 2018-09-30: 60 mg via INTRAVENOUS

## 2018-09-30 MED ORDER — CEFAZOLIN SODIUM-DEXTROSE 2-4 GM/100ML-% IV SOLN
2.0000 g | Freq: Once | INTRAVENOUS | Status: AC
  Administered 2018-09-30: 09:00:00 2 g via INTRAVENOUS
  Filled 2018-09-30: qty 100

## 2018-09-30 MED ORDER — IOHEXOL 300 MG/ML  SOLN
100.0000 mL | Freq: Once | INTRAMUSCULAR | Status: AC
  Administered 2018-09-30: 100 mL via INTRAVENOUS

## 2018-09-30 MED ORDER — ROCURONIUM BROMIDE 10 MG/ML (PF) SYRINGE
PREFILLED_SYRINGE | INTRAVENOUS | Status: AC
  Filled 2018-09-30: qty 10

## 2018-09-30 MED ORDER — OXYCODONE HCL 5 MG/5ML PO SOLN: 5.0000 mg | Freq: Once | ORAL | Status: DC | PRN

## 2018-09-30 MED ORDER — SUCCINYLCHOLINE CHLORIDE 20 MG/ML IJ SOLN
INTRAMUSCULAR | Status: DC | PRN
  Administered 2018-09-30: 120 mg via INTRAVENOUS

## 2018-09-30 MED ORDER — BACITRACIN ZINC 500 UNIT/GM EX OINT
TOPICAL_OINTMENT | CUTANEOUS | Status: AC
  Filled 2018-09-30: qty 56.7

## 2018-09-30 MED ORDER — ONDANSETRON 4 MG PO TBDP: 4.0000 mg | ORAL_TABLET | Freq: Four times a day (QID) | ORAL | Status: DC | PRN

## 2018-09-30 MED ORDER — PROPOFOL 10 MG/ML IV BOLUS
INTRAVENOUS | Status: AC
  Filled 2018-09-30: qty 20

## 2018-09-30 MED ORDER — CHLORHEXIDINE GLUCONATE 4 % EX LIQD: 60.0000 mL | Freq: Once | CUTANEOUS | Status: DC

## 2018-09-30 MED ORDER — ENOXAPARIN SODIUM 40 MG/0.4ML ~~LOC~~ SOLN
40.0000 mg | SUBCUTANEOUS | Status: DC
  Administered 2018-10-01 – 2018-10-03 (×3): 40 mg via SUBCUTANEOUS
  Filled 2018-09-30 (×3): qty 0.4

## 2018-09-30 MED ORDER — BACITRACIN 500 UNIT/GM EX OINT
TOPICAL_OINTMENT | CUTANEOUS | Status: DC | PRN
  Administered 2018-09-30: 1 via TOPICAL

## 2018-09-30 MED ORDER — DOCUSATE SODIUM 100 MG PO CAPS
100.0000 mg | ORAL_CAPSULE | Freq: Two times a day (BID) | ORAL | Status: DC
  Administered 2018-09-30 – 2018-10-03 (×6): 100 mg via ORAL
  Filled 2018-09-30 (×6): qty 1

## 2018-09-30 MED ORDER — VANCOMYCIN HCL 1000 MG IV SOLR
INTRAVENOUS | Status: AC
  Filled 2018-09-30: qty 1000

## 2018-09-30 MED ORDER — GABAPENTIN 100 MG PO CAPS
100.0000 mg | ORAL_CAPSULE | Freq: Three times a day (TID) | ORAL | Status: DC
  Administered 2018-09-30 – 2018-10-03 (×9): 100 mg via ORAL
  Filled 2018-09-30 (×9): qty 1

## 2018-09-30 MED ORDER — ONDANSETRON HCL 4 MG/2ML IJ SOLN
INTRAMUSCULAR | Status: AC
  Filled 2018-09-30: qty 2

## 2018-09-30 MED ORDER — FENTANYL CITRATE (PF) 100 MCG/2ML IJ SOLN
100.0000 ug | Freq: Once | INTRAMUSCULAR | Status: AC
  Administered 2018-09-30: 50 ug via INTRAVENOUS
  Filled 2018-09-30: qty 2

## 2018-09-30 MED ORDER — OXYCODONE HCL 5 MG PO TABS: 5.0000 mg | ORAL_TABLET | Freq: Once | ORAL | Status: DC | PRN

## 2018-09-30 MED ORDER — CEFAZOLIN SODIUM-DEXTROSE 2-4 GM/100ML-% IV SOLN
2.0000 g | INTRAVENOUS | Status: AC
  Administered 2018-09-30: 2 g via INTRAVENOUS

## 2018-09-30 MED ORDER — CEFAZOLIN SODIUM-DEXTROSE 2-4 GM/100ML-% IV SOLN
INTRAVENOUS | Status: AC
  Filled 2018-09-30: qty 100

## 2018-09-30 MED ORDER — FENTANYL CITRATE (PF) 100 MCG/2ML IJ SOLN: 25.0000 ug | INTRAMUSCULAR | Status: DC | PRN

## 2018-09-30 MED ORDER — MORPHINE SULFATE (PF) 2 MG/ML IV SOLN
2.0000 mg | INTRAVENOUS | Status: DC | PRN
  Administered 2018-10-01 – 2018-10-02 (×2): 2 mg via INTRAVENOUS
  Filled 2018-09-30 (×2): qty 1

## 2018-09-30 MED ORDER — SUCCINYLCHOLINE CHLORIDE 200 MG/10ML IV SOSY
PREFILLED_SYRINGE | INTRAVENOUS | Status: AC
  Filled 2018-09-30: qty 10

## 2018-09-30 MED ORDER — OXYCODONE HCL 5 MG PO TABS
5.0000 mg | ORAL_TABLET | ORAL | Status: DC | PRN
  Administered 2018-09-30: 10 mg via ORAL
  Administered 2018-09-30: 23:00:00 5 mg via ORAL
  Administered 2018-10-01 – 2018-10-03 (×6): 10 mg via ORAL
  Filled 2018-09-30: qty 2
  Filled 2018-09-30: qty 1
  Filled 2018-09-30 (×6): qty 2

## 2018-09-30 MED ORDER — DEXAMETHASONE SODIUM PHOSPHATE 10 MG/ML IJ SOLN
INTRAMUSCULAR | Status: DC | PRN
  Administered 2018-09-30: 10 mg via INTRAVENOUS

## 2018-09-30 MED ORDER — 0.9 % SODIUM CHLORIDE (POUR BTL) OPTIME
TOPICAL | Status: DC | PRN
  Administered 2018-09-30: 1000 mL

## 2018-09-30 MED ORDER — TOBRAMYCIN SULFATE 1.2 G IJ SOLR
INTRAMUSCULAR | Status: AC
  Filled 2018-09-30: qty 1.2

## 2018-09-30 MED ORDER — METHOCARBAMOL 500 MG PO TABS
500.0000 mg | ORAL_TABLET | Freq: Four times a day (QID) | ORAL | Status: DC | PRN
  Administered 2018-10-01 – 2018-10-03 (×2): 500 mg via ORAL
  Filled 2018-09-30 (×3): qty 1

## 2018-09-30 MED ORDER — PROMETHAZINE HCL 25 MG/ML IJ SOLN: 6.2500 mg | INTRAMUSCULAR | Status: DC | PRN

## 2018-09-30 MED ORDER — FENTANYL CITRATE (PF) 250 MCG/5ML IJ SOLN
INTRAMUSCULAR | Status: AC
  Filled 2018-09-30: qty 5

## 2018-09-30 MED ORDER — CEFAZOLIN SODIUM-DEXTROSE 2-4 GM/100ML-% IV SOLN
2.0000 g | Freq: Three times a day (TID) | INTRAVENOUS | Status: AC
  Administered 2018-09-30 – 2018-10-01 (×2): 2 g via INTRAVENOUS
  Filled 2018-09-30 (×3): qty 100

## 2018-09-30 MED ORDER — POVIDONE-IODINE 10 % EX SWAB: 2.0000 "application " | Freq: Once | CUTANEOUS | Status: DC

## 2018-09-30 MED ORDER — SODIUM CHLORIDE 0.9 % IR SOLN
Status: DC | PRN
  Administered 2018-09-30: 3000 mL

## 2018-09-30 MED ORDER — VANCOMYCIN HCL 500 MG IV SOLR
INTRAVENOUS | Status: AC
  Filled 2018-09-30: qty 500

## 2018-09-30 MED ORDER — SUGAMMADEX SODIUM 200 MG/2ML IV SOLN
INTRAVENOUS | Status: DC | PRN
  Administered 2018-09-30: 100 mg via INTRAVENOUS
  Administered 2018-09-30: 200 mg via INTRAVENOUS

## 2018-09-30 MED ORDER — LACTATED RINGERS IV SOLN
INTRAVENOUS | Status: DC
  Administered 2018-09-30: 12:00:00 via INTRAVENOUS

## 2018-09-30 MED ORDER — MIDAZOLAM HCL 2 MG/2ML IJ SOLN
INTRAMUSCULAR | Status: AC
  Filled 2018-09-30: qty 2

## 2018-09-30 MED ORDER — LIDOCAINE 2% (20 MG/ML) 5 ML SYRINGE
INTRAMUSCULAR | Status: DC | PRN
  Administered 2018-09-30: 60 mg via INTRAVENOUS

## 2018-09-30 MED ORDER — ACETAMINOPHEN 500 MG PO TABS
1000.0000 mg | ORAL_TABLET | Freq: Four times a day (QID) | ORAL | Status: DC
  Administered 2018-09-30 – 2018-10-03 (×13): 1000 mg via ORAL
  Filled 2018-09-30 (×13): qty 2

## 2018-09-30 MED ORDER — ONDANSETRON HCL 4 MG/2ML IJ SOLN: 4.0000 mg | Freq: Four times a day (QID) | INTRAMUSCULAR | Status: DC | PRN

## 2018-09-30 SURGICAL SUPPLY — 89 items
BANDAGE ACE 4X5 VEL STRL LF (GAUZE/BANDAGES/DRESSINGS) ×3 IMPLANT
BANDAGE ACE 6X5 VEL STRL LF (GAUZE/BANDAGES/DRESSINGS) ×3 IMPLANT
BANDAGE ELASTIC 4 VELCRO ST LF (GAUZE/BANDAGES/DRESSINGS) ×3 IMPLANT
BANDAGE ESMARK 6X9 LF (GAUZE/BANDAGES/DRESSINGS) ×1 IMPLANT
BIT DRILL CANN 3.5MM (DRILL) ×1 IMPLANT
BLADE CLIPPER SURG (BLADE) IMPLANT
BLADE SURG 15 STRL LF DISP TIS (BLADE) ×1 IMPLANT
BLADE SURG 15 STRL SS (BLADE) ×2
BNDG COHESIVE 3X5 TAN STRL LF (GAUZE/BANDAGES/DRESSINGS) ×3 IMPLANT
BNDG COHESIVE 4X5 TAN STRL (GAUZE/BANDAGES/DRESSINGS) ×3 IMPLANT
BNDG ESMARK 6X9 LF (GAUZE/BANDAGES/DRESSINGS) ×3
BNDG GAUZE ELAST 4 BULKY (GAUZE/BANDAGES/DRESSINGS) ×6 IMPLANT
BRUSH SCRUB SURG 4.25 DISP (MISCELLANEOUS) ×6 IMPLANT
CANISTER SUCT 3000ML PPV (MISCELLANEOUS) ×3 IMPLANT
CHLORAPREP W/TINT 26ML (MISCELLANEOUS) ×6 IMPLANT
COVER MAYO STAND STRL (DRAPES) ×3 IMPLANT
COVER SURGICAL LIGHT HANDLE (MISCELLANEOUS) ×6 IMPLANT
COVER WAND RF STERILE (DRAPES) ×3 IMPLANT
CUFF TOURNIQUET SINGLE 34IN LL (TOURNIQUET CUFF) ×3 IMPLANT
DRAPE C-ARM 42X72 X-RAY (DRAPES) ×3 IMPLANT
DRAPE C-ARMOR (DRAPES) ×3 IMPLANT
DRAPE ORTHO SPLIT 77X108 STRL (DRAPES) ×4
DRAPE SURG 17X23 STRL (DRAPES) ×3 IMPLANT
DRAPE SURG ORHT 6 SPLT 77X108 (DRAPES) ×2 IMPLANT
DRAPE U-SHAPE 47X51 STRL (DRAPES) ×3 IMPLANT
DRILL CANN 3.5MM (DRILL) ×3
DRSG ADAPTIC 3X8 NADH LF (GAUZE/BANDAGES/DRESSINGS) ×3 IMPLANT
DRSG PAD ABDOMINAL 8X10 ST (GAUZE/BANDAGES/DRESSINGS) ×6 IMPLANT
ELECT REM PT RETURN 9FT ADLT (ELECTROSURGICAL) ×3
ELECTRODE REM PT RTRN 9FT ADLT (ELECTROSURGICAL) ×1 IMPLANT
EVACUATOR 1/8 PVC DRAIN (DRAIN) IMPLANT
GAUZE SPONGE 4X4 12PLY STRL (GAUZE/BANDAGES/DRESSINGS) ×3 IMPLANT
GAUZE SPONGE 4X4 12PLY STRL LF (GAUZE/BANDAGES/DRESSINGS) ×3 IMPLANT
GLOVE BIO SURGEON STRL SZ 6.5 (GLOVE) ×6 IMPLANT
GLOVE BIO SURGEON STRL SZ7.5 (GLOVE) ×12 IMPLANT
GLOVE BIO SURGEONS STRL SZ 6.5 (GLOVE) ×3
GLOVE BIOGEL PI IND STRL 6.5 (GLOVE) ×1 IMPLANT
GLOVE BIOGEL PI IND STRL 7.5 (GLOVE) ×1 IMPLANT
GLOVE BIOGEL PI INDICATOR 6.5 (GLOVE) ×2
GLOVE BIOGEL PI INDICATOR 7.5 (GLOVE) ×2
GOWN STRL REUS W/ TWL LRG LVL3 (GOWN DISPOSABLE) ×2 IMPLANT
GOWN STRL REUS W/TWL LRG LVL3 (GOWN DISPOSABLE) ×4
HANDPIECE INTERPULSE COAX TIP (DISPOSABLE) ×2
IMMOBILIZER KNEE 22 UNIV (SOFTGOODS) ×3 IMPLANT
K-WIRE 1.8 (WIRE) ×2
K-WIRE FX200X1.8XTHRD TROC (WIRE) ×1
KIT BASIN OR (CUSTOM PROCEDURE TRAY) ×3 IMPLANT
KIT TURNOVER KIT B (KITS) ×3 IMPLANT
KWIRE FX200X1.8XTHRD TROC (WIRE) ×1 IMPLANT
MANIFOLD NEPTUNE II (INSTRUMENTS) ×3 IMPLANT
NDL SUT 6 .5 CRC .975X.05 MAYO (NEEDLE) ×1 IMPLANT
NEEDLE MAYO TAPER (NEEDLE) ×2
NS IRRIG 1000ML POUR BTL (IV SOLUTION) ×3 IMPLANT
PACK ORTHO EXTREMITY (CUSTOM PROCEDURE TRAY) ×3 IMPLANT
PACK TOTAL JOINT (CUSTOM PROCEDURE TRAY) ×3 IMPLANT
PAD ARMBOARD 7.5X6 YLW CONV (MISCELLANEOUS) ×6 IMPLANT
PAD CAST 4YDX4 CTTN HI CHSV (CAST SUPPLIES) ×2 IMPLANT
PADDING CAST COTTON 4X4 STRL (CAST SUPPLIES) ×4
PADDING CAST COTTON 6X4 STRL (CAST SUPPLIES) ×3 IMPLANT
SCREW CANN FULL THD 5.0X75 (Screw) ×6 IMPLANT
SET HNDPC FAN SPRY TIP SCT (DISPOSABLE) ×1 IMPLANT
SPONGE LAP 18X18 RF (DISPOSABLE) ×3 IMPLANT
STAPLER VISISTAT 35W (STAPLE) ×3 IMPLANT
STOCKINETTE IMPERVIOUS LG (DRAPES) ×3 IMPLANT
SUCTION FRAZIER HANDLE 10FR (MISCELLANEOUS) ×2
SUCTION TUBE FRAZIER 10FR DISP (MISCELLANEOUS) ×1 IMPLANT
SUT ETHILON 2 0 FS 18 (SUTURE) IMPLANT
SUT ETHILON 3 0 PS 1 (SUTURE) ×9 IMPLANT
SUT FIBERWIRE #2 38 T-5 BLUE (SUTURE)
SUT MON AB 2-0 CT1 36 (SUTURE) ×3 IMPLANT
SUT PDS AB 0 CT 36 (SUTURE) IMPLANT
SUT VIC AB 0 CT1 27 (SUTURE)
SUT VIC AB 0 CT1 27XBRD ANBCTR (SUTURE) IMPLANT
SUT VIC AB 1 CT1 18XCR BRD 8 (SUTURE) IMPLANT
SUT VIC AB 1 CT1 27 (SUTURE) ×2
SUT VIC AB 1 CT1 27XBRD ANBCTR (SUTURE) ×1 IMPLANT
SUT VIC AB 1 CT1 8-18 (SUTURE)
SUT VIC AB 2-0 CT1 27 (SUTURE) ×4
SUT VIC AB 2-0 CT1 TAPERPNT 27 (SUTURE) ×2 IMPLANT
SUTURE FIBERWR #2 38 T-5 BLUE (SUTURE) IMPLANT
SWAB CULTURE ESWAB REG 1ML (MISCELLANEOUS) IMPLANT
TOWEL OR 17X24 6PK STRL BLUE (TOWEL DISPOSABLE) ×3 IMPLANT
TOWEL OR 17X26 10 PK STRL BLUE (TOWEL DISPOSABLE) ×6 IMPLANT
TRAY FOLEY MTR SLVR 16FR STAT (SET/KITS/TRAYS/PACK) IMPLANT
TUBE CONNECTING 12'X1/4 (SUCTIONS) ×1
TUBE CONNECTING 12X1/4 (SUCTIONS) ×2 IMPLANT
UNDERPAD 30X30 (UNDERPADS AND DIAPERS) ×3 IMPLANT
WATER STERILE IRR 1000ML POUR (IV SOLUTION) ×6 IMPLANT
YANKAUER SUCT BULB TIP NO VENT (SUCTIONS) ×3 IMPLANT
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
## 2018-09-30 NOTE — ED Notes (Signed)
Aspen collar replaced ems c collar
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
## 2018-09-30 NOTE — Transfer of Care (Signed)
Immediate Anesthesia Transfer of Care Note  Patient: Colton Schroeder  Procedure(s) Performed: IRRIGATION AND DEBRIDEMENT open  TIBIAL PLATEAU (Left Leg Lower) OPEN REDUCTION INTERNAL FIXATION (ORIF) TIBIAL PLATEAU  WITH PERCUTANEOUS SCREWS (Left Leg Lower)  Patient Location: PACU  Anesthesia Type:General  Level of Consciousness: drowsy  Airway & Oxygen Therapy: Patient Spontanous Breathing  Post-op Assessment: Report given to RN, Post -op Vital signs reviewed and stable and Patient moving all extremities X 4  Post vital signs: Reviewed and stable  Last Vitals:  Vitals Value Taken Time  BP 121/87 09/30/2018  1:22 PM  Temp    Pulse 73 09/30/2018  1:24 PM  Resp 15 09/30/2018  1:24 PM  SpO2 98 % 09/30/2018  1:24 PM  Vitals shown include unvalidated device data.  Last Pain:  Vitals:   09/30/18 0736  TempSrc: Axillary  PainSc:          Complications: No apparent anesthesia complications

## 2018-09-30 NOTE — Anesthesia Procedure Notes (Signed)
Procedure Name: Intubation Date/Time: 09/30/2018 12:15 PM Performed by: Burt Ek, CRNA Pre-anesthesia Checklist: Patient identified, Emergency Drugs available, Suction available and Patient being monitored Patient Re-evaluated:Patient Re-evaluated prior to induction Oxygen Delivery Method: Circle system utilized Preoxygenation: Pre-oxygenation with 100% oxygen Induction Type: IV induction and Rapid sequence Laryngoscope Size: Glidescope and 4 Grade View: Grade I Tube type: Oral Tube size: 7.5 mm Number of attempts: 1 Airway Equipment and Method: Oral airway,  Video-laryngoscopy and Rigid stylet Placement Confirmation: ETT inserted through vocal cords under direct vision,  positive ETCO2 and breath sounds checked- equal and bilateral Secured at: 23 cm Tube secured with: Tape Dental Injury: Teeth and Oropharynx as per pre-operative assessment  Difficulty Due To: Difficult Airway- due to cervical collar Comments: Head and neck remained in neutral spine with patient in c-collar.

## 2018-09-30 NOTE — ED Notes (Signed)
Pt has no ID on him, pt not alert enough to give [place] name/ DOB. Pt belongings: one 20 dollar bill, lighter, pants, one pair of shoes, shirt. Placed in pt belonging bag and labeled.

## 2018-09-30 NOTE — ED Notes (Signed)
This RN was able to call pt's gf, Jordan (obtained number from [facility]) [phone]) to let her know pt is going to surgery and will be admitted. I gave her the phone number to the hospital so she can call for updates once surgery is completed.
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
## 2018-09-30 NOTE — ED Notes (Signed)
Puncture wound to left shin cleansed and dressed. Ortho tech at bedside to place knee immobilizer on left leg.

## 2018-09-30 NOTE — ED Notes (Signed)
Paged trauma per verbal order from Dr. Rubin Payor

## 2018-09-30 NOTE — ED Notes (Signed)
Ortho consulted °

## 2018-09-30 NOTE — Procedures (Signed)
Pre-procedure diagnosis: difficult foley catheterization Post-procedure diagnosis: as above   Procedure performed: Placement of foley catheter, complicated   Surgeon: Dr. Kathryn Hacker Gessner   Findings: Approximately 0.5-1 cm into urethral meatus, there was resistance to foley catheter placement. It almost felt as though there was a turn to the urethra or a small flap of tissue at this time - did not feel like a dense stricture. No hypospadias.  Specimen: None   Drains: 16F councill tipped foley   Indications: Patient unable to void on his own to provide urine sample. Nursing staff have been unsuccessful at placing catheter with resulting blood per urethra. Foley catheter placement by urology requested by trauma surgery.    Procedure: Gentials were prepped and draped in the routine sterile fashion.  10cc of 1% viscous lidocaine jelly was then injected into the patient's urethra. An 16F catheter was then gently passed into the urethra. Resistance was met at approximately at 0.5-1 cm into urethral meatus with inability to pass catheter. A 12Fr straight tipped catheter was attempted to be passed but once again resistance was met. It did not feel like a dense stricture, but rather felt like there was possibly a turn to the urethra or a small flap of tissue at this location.   An angled glide wire was passed through the urethra without resistance. A 5 Fr open-ended catheter was passed over the wire and urine was aspirated through the open-ended after wire removal, confirming intravesical placement. 16 Fr Councill catheter was placed over the wire and met slight resistance in the fossa navicularis, but ultimately passed easily. Clear yellow urine drained from the bladder. 10 cc of sterile water was placed in the balloon.    Patient tolerated the procedure well - no immediate issues.   Disposition: Continue indwelling foley catheter for 3 days. Okay for trial of void at that time. If patient is  discharged prior to that time, please call urology to arrange for outpatient trial of void. Otherwise, urology will arrange outpatient follow-up in 2 weeks to confirm he is voiding well after foley removal.   

## 2018-09-30 NOTE — ED Notes (Signed)
CBG reading 103 mg/dL
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
## 2018-09-30 NOTE — ED Notes (Signed)
Pt's cash and lighter put in ED Security office
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
## 2018-09-30 NOTE — Anesthesia Preprocedure Evaluation (Addendum)
Anesthesia Evaluation  Patient identified by MRN, date of birth, ID bandGeneral Assessment Comment: Easily arousable, lucid when talking. Falls asleep intermittently during interview   Reviewed: Allergy & Precautions, NPO status , Patient's Chart, lab work & pertinent test results  History of Anesthesia Complications Negative for: history of anesthetic complications  Airway Mallampati: III  TM Distance: >3 FB Neck ROM: Limited    Dental  (+) Dental Advisory Given, Teeth Intact   Pulmonary Current Smoker,    breath sounds clear to auscultation       Cardiovascular negative cardio ROS   Rhythm:Regular Rate:Normal     Neuro/Psych negative psych ROS   GI/Hepatic negative GI ROS, Neg liver ROS,   Endo/Other  negative endocrine ROS  Renal/GU Renal InsufficiencyRenal disease     Musculoskeletal negative musculoskeletal ROS (+)   Abdominal   Peds  Hematology negative hematology ROS (+)   Anesthesia Other Findings   Reproductive/Obstetrics                            Anesthesia Physical Anesthesia Plan  ASA: II and emergent  Anesthesia Plan: General   Post-op Pain Management:    Induction: Intravenous  PONV Risk Score and Plan: 3 and Treatment may vary due to age or medical condition, Ondansetron, Dexamethasone and Midazolam  Airway Management Planned: Oral ETT and Video Laryngoscope Planned  Additional Equipment: None  Intra-op Plan:   Post-operative Plan: Extubation in OR  Informed Consent: I have reviewed the patients History and Physical, chart, labs and discussed the procedure including the risks, benefits and alternatives for the proposed anesthesia with the patient or authorized representative who has indicated his/her understanding and acceptance.     Dental advisory given  Plan Discussed with: CRNA and Anesthesiologist  Anesthesia Plan Comments:        Anesthesia  Quick Evaluation

## 2018-09-30 NOTE — ED Notes (Signed)
Paged micheal from Ortho per RN

## 2018-10-01 ENCOUNTER — Encounter (HOSPITAL_COMMUNITY): Payer: Self-pay | Admitting: Student

## 2018-10-01 LAB — BASIC METABOLIC PANEL WITH GFR
Anion gap: 10 (ref 5–15)
BUN: 7 mg/dL (ref 6–20)
CO2: 24 mmol/L (ref 22–32)
Calcium: 8.9 mg/dL (ref 8.9–10.3)
Chloride: 103 mmol/L (ref 98–111)
Creatinine, Ser: 1.31 mg/dL — ABNORMAL HIGH (ref 0.61–1.24)
GFR calc Af Amer: >60 mL/min
GFR calc non Af Amer: >60 mL/min
Glucose, Bld: 118 mg/dL — ABNORMAL HIGH (ref 70–99)
Potassium: 4.2 mmol/L (ref 3.5–5.1)
Sodium: 137 mmol/L (ref 135–145)

## 2018-10-01 LAB — CBC
HCT: 43.4 % (ref 39.0–52.0)
Hemoglobin: 14.6 g/dL (ref 13.0–17.0)
MCH: 30.8 pg (ref 26.0–34.0)
MCHC: 33.6 g/dL (ref 30.0–36.0)
MCV: 91.6 fL (ref 80.0–100.0)
Platelets: 265 10*3/uL (ref 150–400)
RBC: 4.74 MIL/uL (ref 4.22–5.81)
RDW: 12.4 % (ref 11.5–15.5)
WBC: 17.8 10*3/uL — ABNORMAL HIGH (ref 4.0–10.5)
nRBC: 0 % (ref 0.0–0.2)

## 2018-10-01 LAB — HIV ANTIBODY (ROUTINE TESTING W REFLEX): HIV Screen 4th Generation wRfx: NONREACTIVE
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
## 2018-10-01 NOTE — Anesthesia Postprocedure Evaluation (Signed)
Anesthesia Post Note  Patient: Tyrez Sever  Procedure(s) Performed: IRRIGATION AND DEBRIDEMENT open  TIBIAL PLATEAU (Left Leg Lower) OPEN REDUCTION INTERNAL FIXATION (ORIF) TIBIAL PLATEAU  WITH PERCUTANEOUS SCREWS (Left Leg Lower)     Patient location during evaluation: PACU Anesthesia Type: General Level of consciousness: awake and alert Pain management: pain level controlled Vital Signs Assessment: post-procedure vital signs reviewed and stable Respiratory status: spontaneous breathing, nonlabored ventilation and respiratory function stable Cardiovascular status: blood pressure returned to baseline and stable Postop Assessment: no apparent nausea or vomiting Anesthetic complications: no    Last Vitals:  Vitals:   09/30/18 2354 10/01/18 0330  BP: 133/74 120/69  Pulse: 73 74  Resp: (!) 22 17  Temp: (!) 36.3 C 36.6 C  SpO2: 100% 98%    Last Pain:  Vitals:   10/01/18 0330  TempSrc: Oral  PainSc:                  Beryle Lathe
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
## 2018-10-02 LAB — BASIC METABOLIC PANEL
Anion gap: 8 (ref 5–15)
BUN: 8 mg/dL (ref 6–20)
CO2: 24 mmol/L (ref 22–32)
Calcium: 8.7 mg/dL — ABNORMAL LOW (ref 8.9–10.3)
Chloride: 106 mmol/L (ref 98–111)
Creatinine, Ser: 1.12 mg/dL (ref 0.61–1.24)
GFR calc Af Amer: >60 mL/min (ref >60)
GFR calc non Af Amer: >60 mL/min (ref >60)
Glucose, Bld: 110 mg/dL — ABNORMAL HIGH (ref 70–99)
Potassium: 4.1 mmol/L (ref 3.5–5.1)
Sodium: 138 mmol/L (ref 135–145)

## 2018-10-02 LAB — CBC
HCT: 40.9 % (ref 39.0–52.0)
Hemoglobin: 13.6 g/dL (ref 13.0–17.0)
MCH: 31 pg (ref 26.0–34.0)
MCHC: 33.3 g/dL (ref 30.0–36.0)
MCV: 93.2 fL (ref 80.0–100.0)
Platelets: 218 10*3/uL (ref 150–400)
RBC: 4.39 MIL/uL (ref 4.22–5.81)
RDW: 13 % (ref 11.5–15.5)
WBC: 10.7 10*3/uL — ABNORMAL HIGH (ref 4.0–10.5)
nRBC: 0 % (ref 0.0–0.2)
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
## 2018-10-03 MED ORDER — ASPIRIN EC 325 MG PO TBEC: 325.0000 mg | DELAYED_RELEASE_TABLET | Freq: Every day | ORAL | 3 refills | Status: AC

## 2018-10-03 MED ORDER — GABAPENTIN 100 MG PO CAPS: 100.0000 mg | ORAL_CAPSULE | Freq: Three times a day (TID) | ORAL | 0 refills | Status: AC

## 2018-10-03 MED ORDER — ACETAMINOPHEN 500 MG PO TABS: 1000.0000 mg | ORAL_TABLET | Freq: Four times a day (QID) | ORAL | 0 refills | Status: AC

## 2018-10-03 MED ORDER — DOCUSATE SODIUM 100 MG PO CAPS: 100.0000 mg | ORAL_CAPSULE | Freq: Two times a day (BID) | ORAL | 0 refills | Status: AC

## 2018-10-03 MED ORDER — METHOCARBAMOL 500 MG PO TABS: 500.0000 mg | ORAL_TABLET | Freq: Four times a day (QID) | ORAL | 0 refills | Status: AC | PRN

## 2018-10-03 MED ORDER — OXYCODONE HCL 5 MG PO TABS: 5.0000 mg | ORAL_TABLET | ORAL | 0 refills | Status: AC | PRN

## 2018-10-03 NOTE — TOC Transition Note (Signed)
Transition of Care [facility]) - CM/SW Discharge Note   Patient Details  Name: Juana Wittich MRN: [id number] Date of Birth: 1990/05/29  Transition of Care [facility] CM/SW Contact:  Deveron Furlong, RN Phone Number: 10/03/2018, 11:49 AM   Clinical Narrative:    Patient to return home with DME.  Pt requested tub bench and 3n1.  Ordered from [employer] to be delivered to room prior to d/c.  No further dc needs.    Final next level of care: Home/Self Care Barriers to Discharge: No Barriers Identified    Discharge Plan and Services                DME Arranged: 3-N-1, Tub bench DME Agency: [facility]

## 2018-10-03 NOTE — Discharge Instructions (Signed)
You are non-weightbaring to your left lower extremity Keep incision clean and dry Call urology office if you have any issues with voiding (peeing)
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
## 2018-10-03 NOTE — Progress Notes (Signed)
Orthopedic Tech Progress Note Patient Details:  Colton Schroeder May 24, 1990 [id number]  Ortho Devices Type of Ortho Device: Crutches Ortho Device/Splint Location: LLE Ortho Device/Splint Interventions: Adjustment   Post Interventions Instructions Provided: Care of device, Adjustment of device   Saul Fordyce 10/03/2018, 12:43 PM
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
## 2018-10-04 ENCOUNTER — Encounter (HOSPITAL_BASED_OUTPATIENT_CLINIC_OR_DEPARTMENT_OTHER): Payer: Self-pay | Admitting: *Deleted

## 2018-10-12 ENCOUNTER — Emergency Department (HOSPITAL_COMMUNITY)
Admission: EM | Admit: 2018-10-12 | Discharge: 2018-10-12 | Disposition: A | Discharge status: Home / Self Care | Payer: Self-pay | Attending: Emergency Medicine | Admitting: Emergency Medicine

## 2018-10-12 ENCOUNTER — Other Ambulatory Visit: Payer: Self-pay

## 2018-10-12 ENCOUNTER — Encounter (HOSPITAL_COMMUNITY): Payer: Self-pay

## 2018-10-12 DIAGNOSIS — M79605 Pain in left leg: Secondary | ICD-10-CM | POA: Insufficient documentation

## 2018-10-12 DIAGNOSIS — F17210 Nicotine dependence, cigarettes, uncomplicated: Secondary | ICD-10-CM | POA: Insufficient documentation

## 2018-10-12 DIAGNOSIS — Z79899 Other long term (current) drug therapy: Secondary | ICD-10-CM | POA: Insufficient documentation

## 2018-10-12 DIAGNOSIS — Z7982 Long term (current) use of aspirin: Secondary | ICD-10-CM | POA: Insufficient documentation

## 2018-10-12 NOTE — ED Notes (Signed)
While providing care for another patient, Dr. Patria Mane assessed patient and provided his discharge papers to him. Unable to assess or obtain another set of vital signs.

## 2018-10-12 NOTE — ED Triage Notes (Signed)
Patient has his leg wrapped where sutures are. Patient states he began having redness, pain and swelling x 3-4 days ago to the left foot and left leg.

## 2018-10-12 NOTE — ED Notes (Signed)
Patient brought to triage 1. Patient left the room because his wife could not find his cell phone. Patient states he would return.
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
## 2021-11-29 ENCOUNTER — Encounter (HOSPITAL_COMMUNITY): Payer: Self-pay | Admitting: Student

## 2021-11-29 ENCOUNTER — Ambulatory Visit (HOSPITAL_COMMUNITY)
Admission: EM | Admit: 2021-11-29 | Discharge: 2021-11-29 | Disposition: A | Discharge status: Home / Self Care | Payer: No Payment, Other | Attending: Psychiatry | Admitting: Psychiatry

## 2021-11-29 DIAGNOSIS — F17200 Nicotine dependence, unspecified, uncomplicated: Secondary | ICD-10-CM | POA: Diagnosis present

## 2021-11-29 DIAGNOSIS — F411 Generalized anxiety disorder: Secondary | ICD-10-CM | POA: Diagnosis present

## 2021-11-29 DIAGNOSIS — F4310 Post-traumatic stress disorder, unspecified: Secondary | ICD-10-CM
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
## 2021-11-29 NOTE — Discharge Instructions (Addendum)
To see which pharmacy near you is the CHEAPEST for certain medications, please use GoodRx. It is free website and has a free phone app.      Also consider looking at [facility] $4.00 or Public[facility] $7.00 prescription list. Both are free to view if googled "[facility]4 prescription" and "[facility] $7 prescription". These are set prices, no insurance required.   For issues with sleep, please use this free app for insomnia called CBT-I. Let your doctors and therapists know so they can help with extra tips and tricks or for guidance and accountability. NO ADDS on the app.     For therapy outside the hospital, please ask for these specific types of therapy: CBT   Please make regular appointments with an outpatient psychiatrist and other doctors once you leave the hospital.   Suicide hotline: 988 Emergency: 911   Medication Assistance Program [facility] Department of Public Health [address] [place], [place], [address] [phone] phone   Check to see if you are eligible for [facility] @ [email or web address] or call [phone].   Medication Management and Therapy for No Insurance/IPRS  Based on observation and your report, outpatient services with psychiatry and therapy have been recommended.  It is imperative to your mental health that seek out services 7-10 day from your day of discharge to mitigate further risk to your safety and overall well-being. A list of referrals has been provided below to get you started on your search.  You are not limited to the list provided.  In case of an urgent crisis, you may contact the Mobile Crisis Unit with [facility] at 1.563-637-1660.   You have the option to call 211 for assistance in finding additional mental health agencies if these do not meet your needs.   For your behavioral health needs you are advised to follow up with [facility] at your earliest opportunity:             [facility]      [address] SECOND FLOOR      [place], [place] [address]      [phone]       They offer psychiatry/medication management and therapy.  New patients are seen in their walk-in clinic.  Walk-in hours are Monday, Wednesday, Thursday and Friday from 8:00 am - 11:00 am for psychiatry, and Monday and Wednesday from 8:00 am - 11:00 am for therapy.  Walk-in patients are seen on a first come, first served basis, so try to arrive as early as possible for the best chance of being seen the same day.  BE SURE TO TAKE THE ELEVATOR TO THE SECOND FLOOR.  Please note that to be eligible for services you must bring an ID or a piece of mail with your name and a [facility] address.          [facility] [place]      [address]      [place], [place] [address]      [phone]        [facility]      [address] [place], [place], [address]      [phone] phone      [phone] fax        [facility]      [facility]      [address]      [place],  [place], [address]      [phone] phone to set up initial appointment       [facility]     [address]     [place], [place], [address]     [phone] mobile     [phone] fax      [facility] [place]     [address] [place], [place], [address]     [phone] phone   Primary Care Providers  [facility] Locations           1. Family Medicine at [place], [address], [place], [place], [address]                     M-F 8:30am - 5:30pm            [phone] phone; [phone] fax             2. Family Medicine at [place], [place]. [address], [place], [place], [address]                     Sindy Messing, and Thurs 8am - [place]                 [phone] phone; [phone] fax             3. Family Medicine at [place], [place], [place], [address]                      M-F 8am - 5pm             [phone] phone; [phone] fax             4. Family Medicine at [facility], [place] [address], [place], [place], [address]                     M-F 8:30am - 5:30pm            [phone] phone; [phone] fax                     Saturday's  9am-1pm (October through March)             5. Family Medicine at [facility], [address], [place], [place], [address]                     M-Thurs Lewayne Bunting               [phone] phone; [phone] fax   [facility] Department [address] [place], [place], [address] [phone] phone   [facility] Department [address] [place], [place], [address] [phone] phone

## 2021-11-29 NOTE — BH Assessment (Signed)
LCSW Progress Note   Per Princess Bruins, DO, this pt does not require psychiatric hospitalization at this time.  Pt is psychiatrically cleared.  Discharge instructions include several resources for outpatient therapy and medication management, the mobile crisis, number, 211, and primary care clinics.  EDP Princess Bruins, DO, has been notified.  Hansel Starling, MSW, LCSW [facility] [phone] or [phone]

## 2021-11-29 NOTE — BH Assessment (Signed)
Pt presenting to [facility] voluntarily with chief complaint of depression and anxiety. Pt was seeing a psychiatrist in prison and started on medications for PTSD, depression and anxiety. Pt was released a month ago and feels like the medications is not working. Pt reports "spacing out" and feels like people are watching him and following him while out in public. Pt reports issues psychosocial stressors. Pt denies SI, HI, AVH and substance use.
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
## 2021-12-02 ENCOUNTER — Encounter (HOSPITAL_COMMUNITY): Payer: Self-pay

## 2022-07-21 ENCOUNTER — Telehealth: Payer: Self-pay

## 2022-07-21 NOTE — Telephone Encounter (Signed)
Mychart msg sent
